# Patient Record
Sex: Male | Born: 1995 | Race: White | Hispanic: No | Marital: Single | Smoking: Never smoker
Health system: Southern US, Community
[De-identification: ages and names within clinical notes are randomized; demographics above are authoritative.]

---
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
## 2013-07-30 ENCOUNTER — Other Ambulatory Visit (HOSPITAL_COMMUNITY): Payer: Self-pay | Admitting: Gastroenterology

## 2013-07-30 DIAGNOSIS — R945 Abnormal results of liver function studies: Principal | ICD-10-CM

## 2013-07-30 DIAGNOSIS — R7989 Other specified abnormal findings of blood chemistry: Secondary | ICD-10-CM

## 2013-07-31 ENCOUNTER — Ambulatory Visit (HOSPITAL_COMMUNITY)
Admission: RE | Admit: 2013-07-31 | Discharge: 2013-07-31 | Disposition: A | Discharge status: Home / Self Care | Payer: 59 | Source: Ambulatory Visit | Attending: Gastroenterology | Admitting: Gastroenterology

## 2013-07-31 DIAGNOSIS — R7989 Other specified abnormal findings of blood chemistry: Secondary | ICD-10-CM

## 2013-07-31 DIAGNOSIS — R748 Abnormal levels of other serum enzymes: Secondary | ICD-10-CM | POA: Diagnosis not present

## 2013-07-31 DIAGNOSIS — R112 Nausea with vomiting, unspecified: Secondary | ICD-10-CM | POA: Insufficient documentation

## 2013-07-31 DIAGNOSIS — R945 Abnormal results of liver function studies: Secondary | ICD-10-CM

## 2016-01-05 IMAGING — US US ABDOMEN COMPLETE
1 series · 14 of 25 positions shown · non-contrast
Comparison: None.

CLINICAL DATA: Elevated liver enzymes ; nausea and vomiting

EXAM:
ULTRASOUND ABDOMEN COMPLETE

[Series 1: us abdomen complete · 0.24mm/px · 14 of 77 slices shown]
[im 1/77]
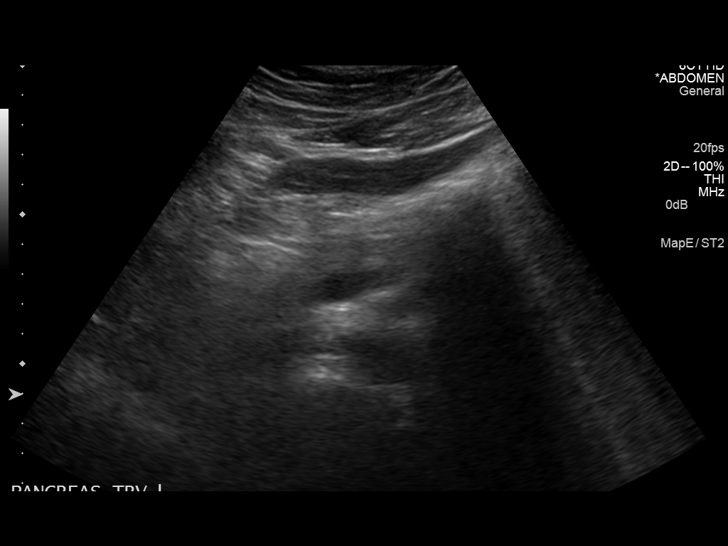
[im 7/77]
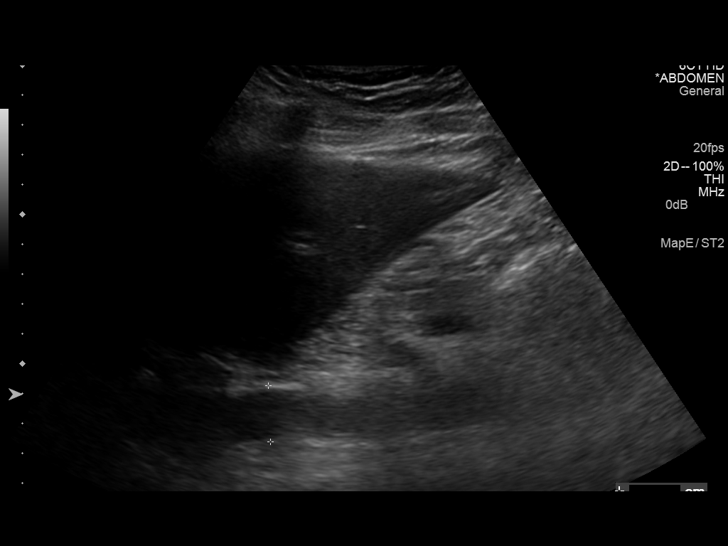
[im 13/77]
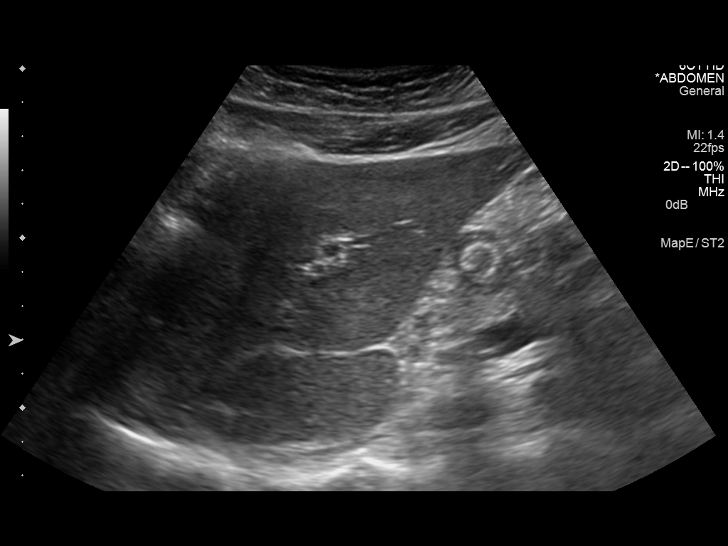
[im 20/77]
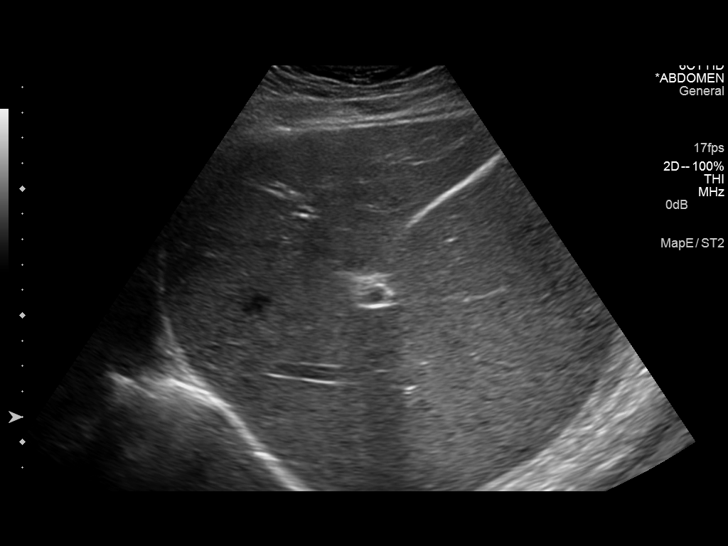
[im 26/77]
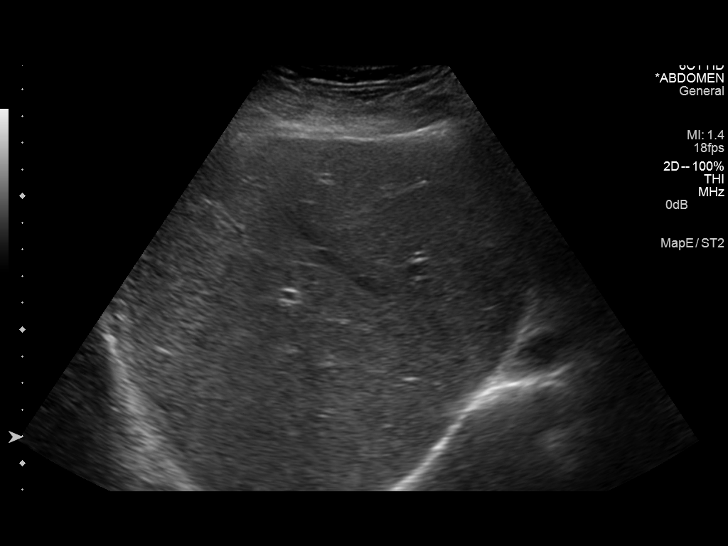
[im 29/77]
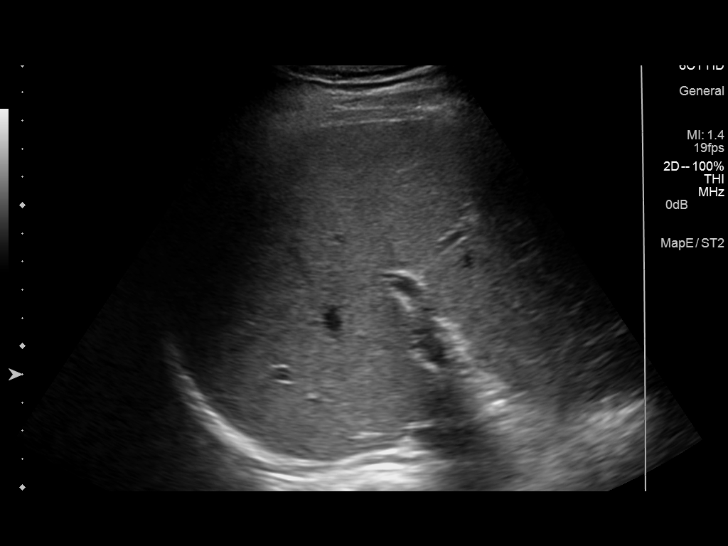
[im 35/77]
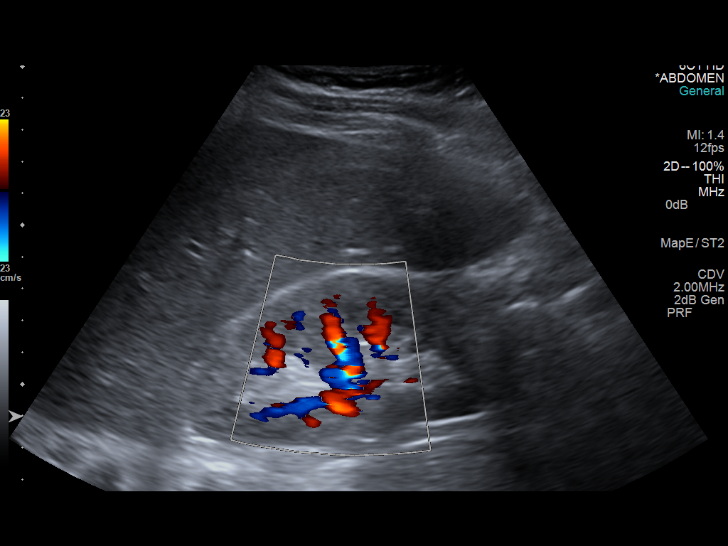
[im 42/77]
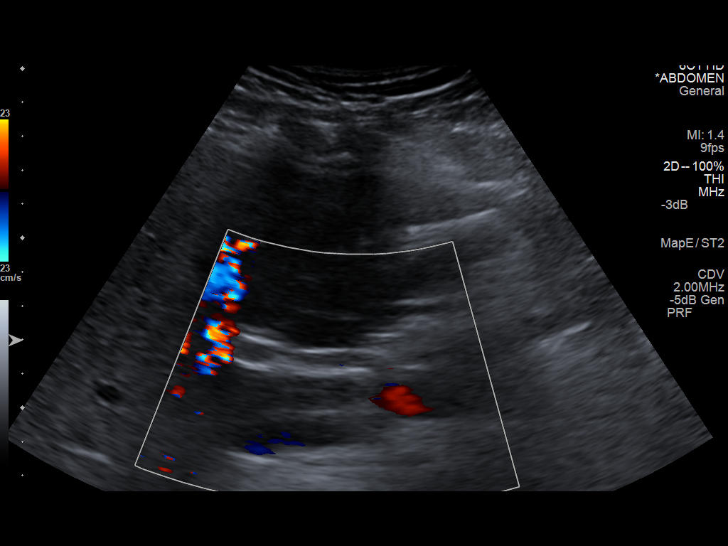
[im 48/77]
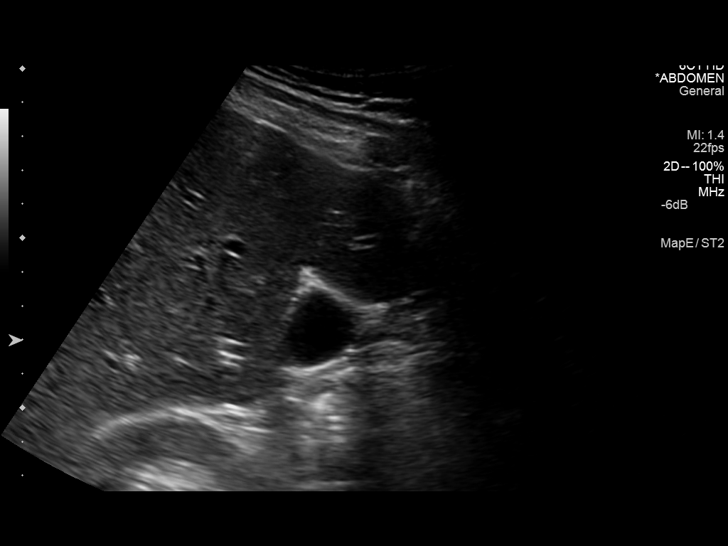
[im 51/77]
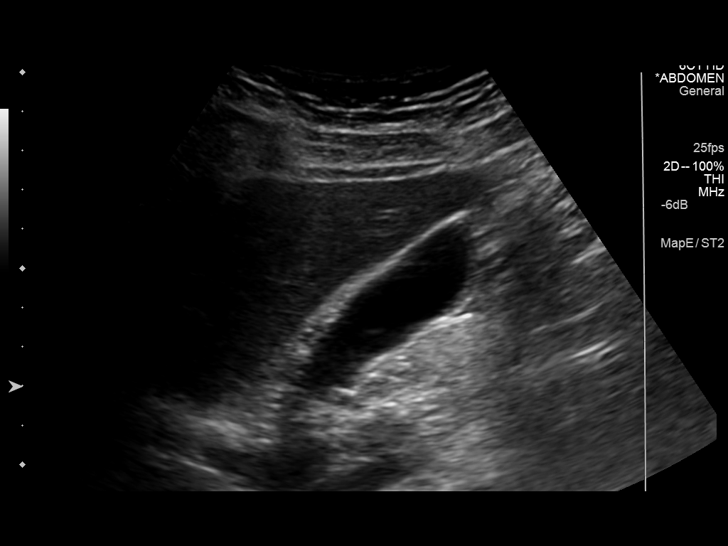
[im 58/77]
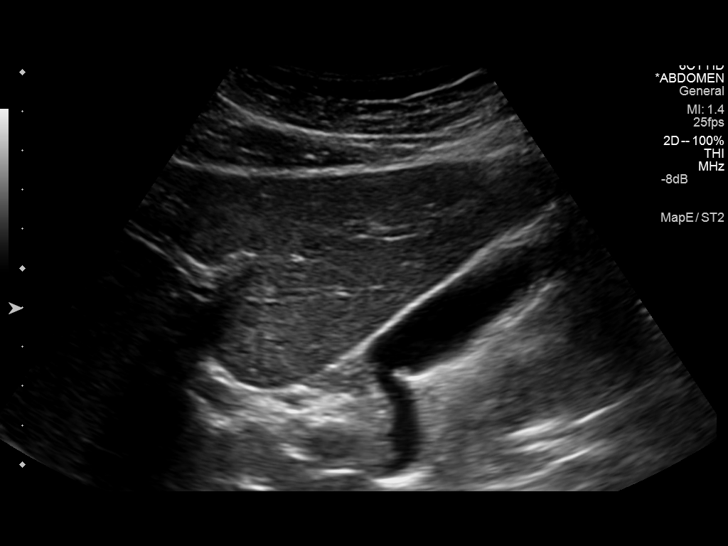
[im 64/77]
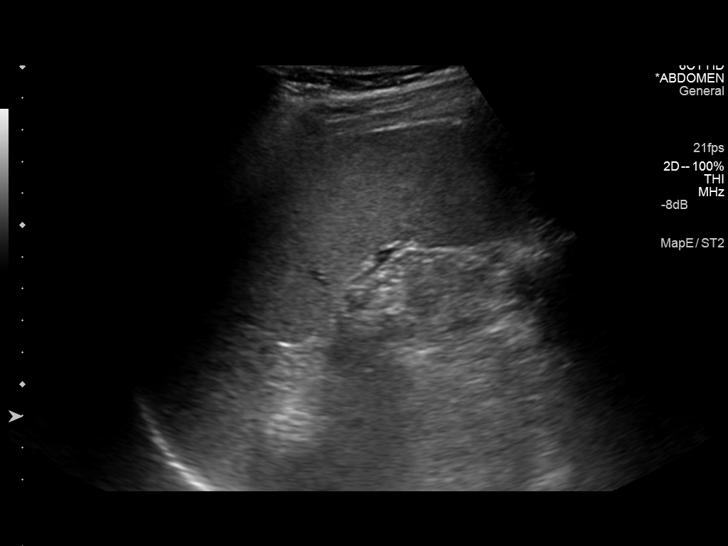
[im 70/77]
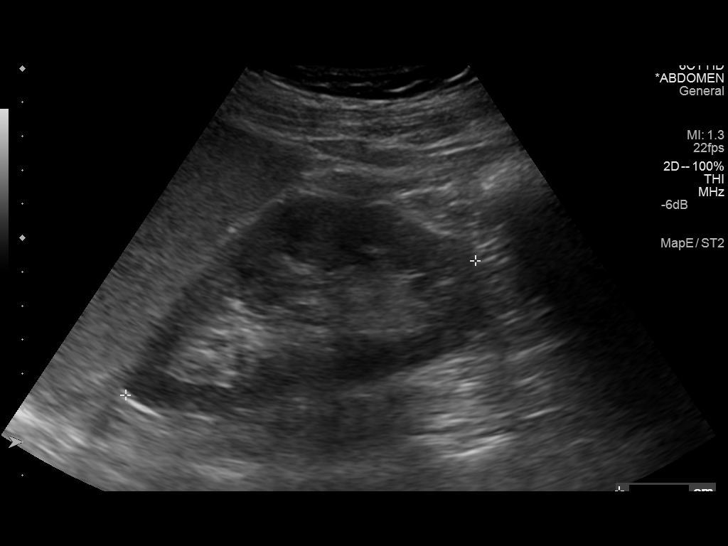
[im 77/77]
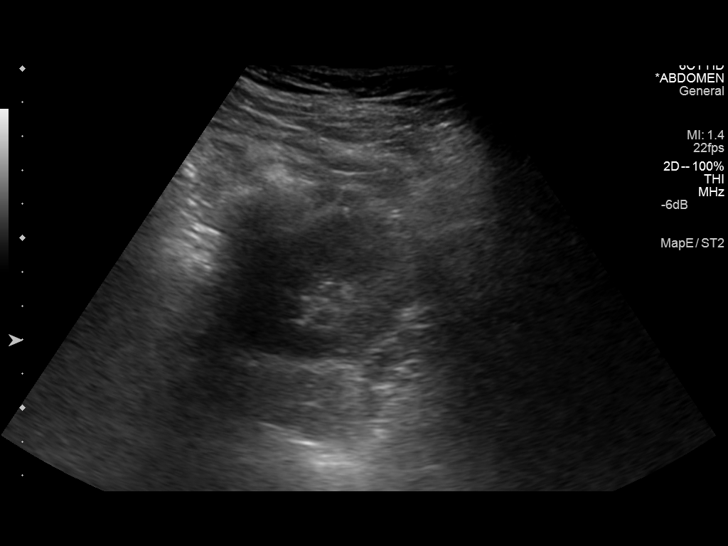

[14 of 25 positions shown; findings below may reference images not displayed]

FINDINGS: Gallbladder:

No gallstones or wall thickening visualized. There is no
pericholecystic fluid. No sonographic Murphy sign noted.

Common bile duct:

Diameter: 4 mm. There is no intrahepatic, common hepatic, or common
bile duct dilatation.

Liver:

No focal lesion identified. Within normal limits in parenchymal
echogenicity.

IVC:

No abnormality visualized.

Pancreas:

No mass or inflammatory focus.

Spleen:

Spleen is prominent measuring 13.8 x 4.7 x 13.9 cm with a measured
volume of 469 cubic cm. No focal splenic lesions are identified.

Right Kidney:

Length: 10.0 cm. Echogenicity within normal limits. No mass or
hydronephrosis visualized.

Left Kidney:

Length: 11.0 cm. Echogenicity within normal limits. No mass or
hydronephrosis visualized.

Abdominal aorta:

No aneurysm visualized.

Other findings:

No demonstrable ascites.
IMPRESSION: Enlarged spleen. Spleen otherwise appears normal. Study otherwise
unremarkable.
